# Patient Record
Sex: Female | Born: 2000 | Race: Black or African American | Hispanic: No | Marital: Single | Smoking: Never smoker
Health system: Southern US, Community
[De-identification: ages and names within clinical notes are randomized; demographics above are authoritative.]

---
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
## 2019-05-29 IMAGING — CT CT ABD-PELV W/ CM
2 of 4 series · 16 of 46 positions shown, 18 images · IV contrast (APPLIED)
Comparison: None.

CLINICAL DATA: RIGHT lower quadrant pain and fever, nausea and
weakness. Suspect appendicitis or abscess.

EXAM:
CT ABDOMEN AND PELVIS WITH CONTRAST
TECHNIQUE: Multidetector CT imaging of the abdomen and pelvis was performed
using the standard protocol following bolus administration of
intravenous contrast.
CONTRAST:  100mL 50NNPY-0ZZ IOPAMIDOL (50NNPY-0ZZ) INJECTION 61%

[Series 3: abd/ pelvis 5.0 i30f 2 · axial · 0.68mm/px · z∈[+908,+1313]mm · 13 of 89 slices shown, 15 images]
[im 4/89  soft-tissue]
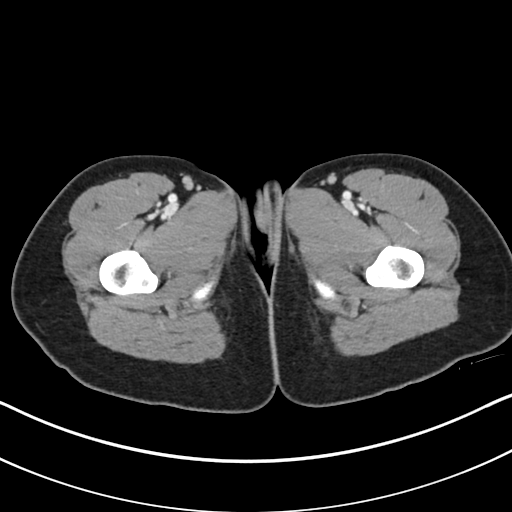
[im 4/89  bone]
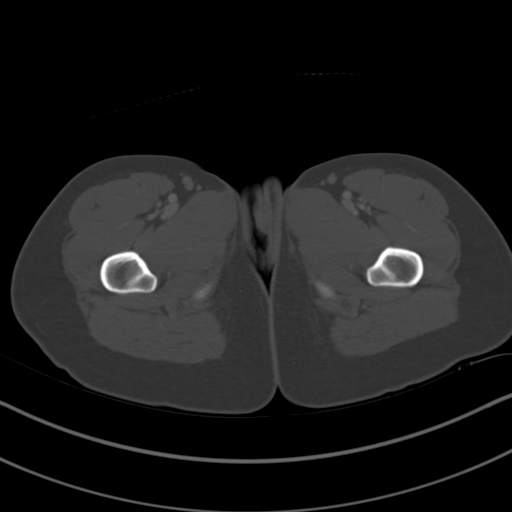
[im 11/89  soft-tissue]
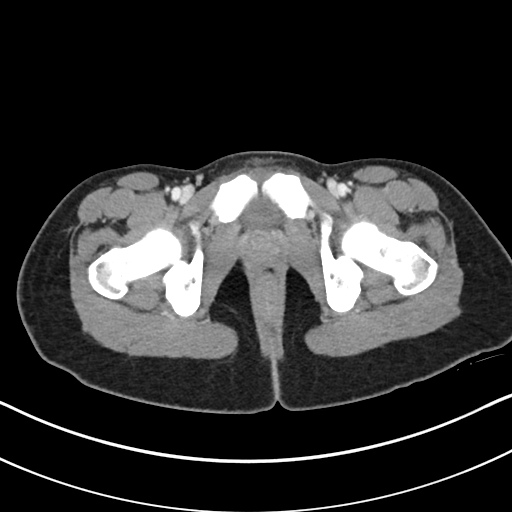
[im 17/89  soft-tissue]
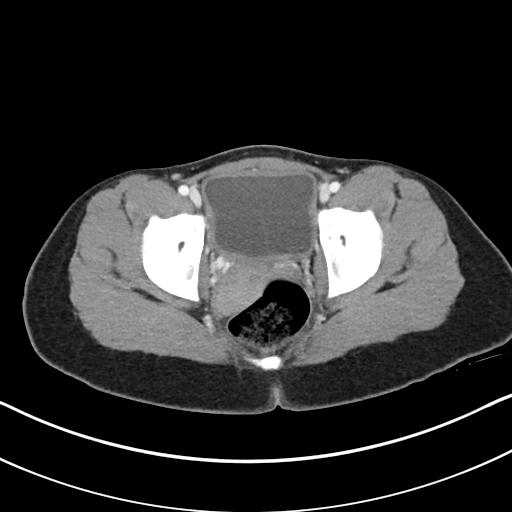
[im 24/89  soft-tissue]
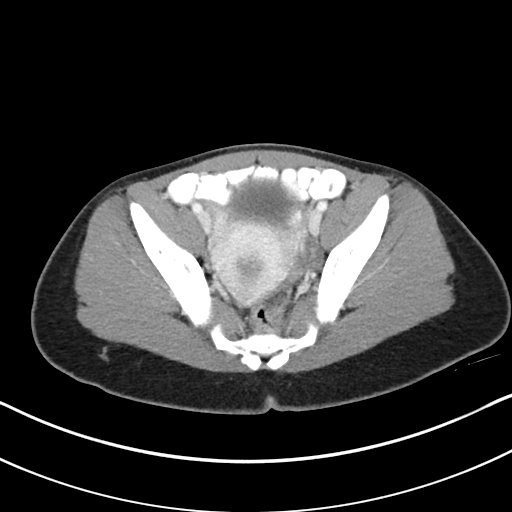
[im 31/89  soft-tissue]
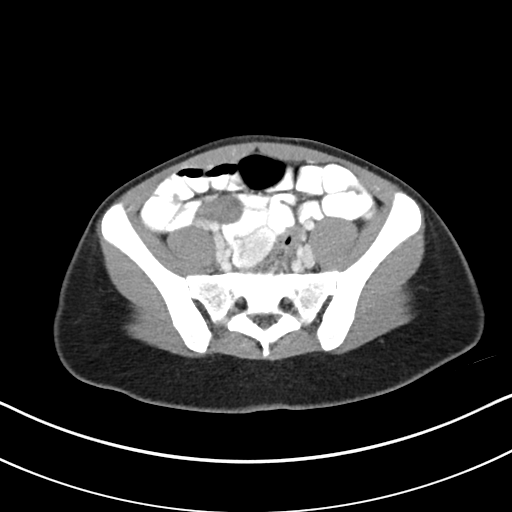
[im 38/89  soft-tissue]
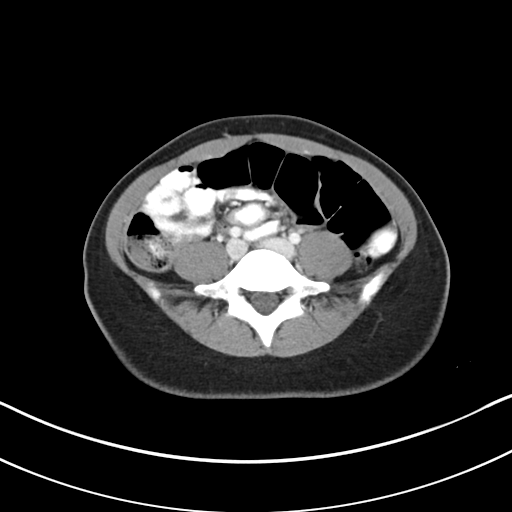
[im 45/89  soft-tissue]
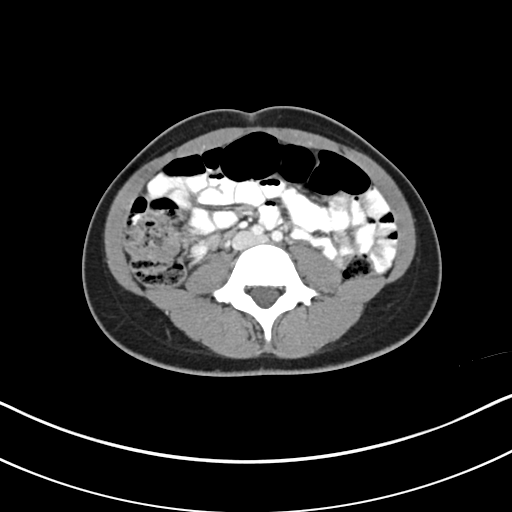
[im 51/89  soft-tissue]
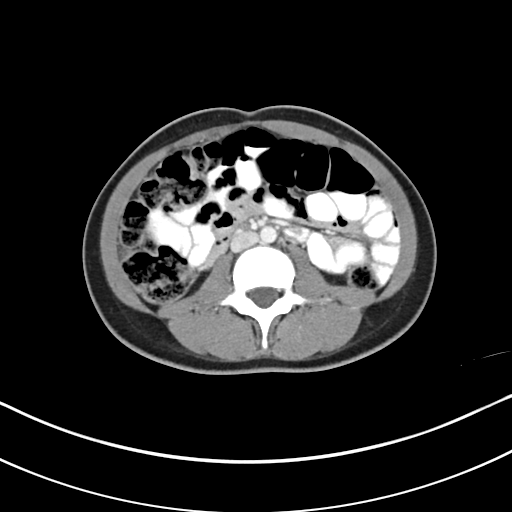
[im 58/89  soft-tissue]
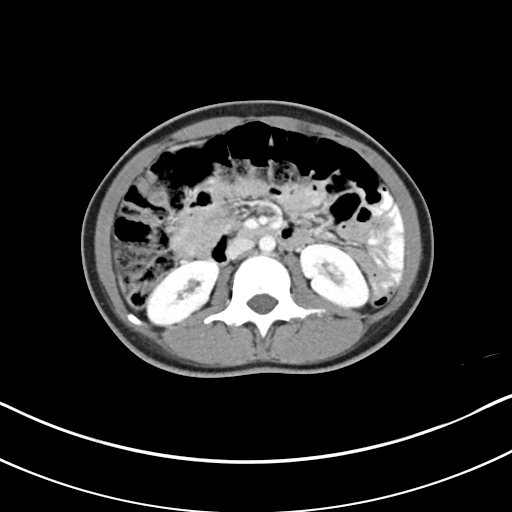
[im 58/89  bone]
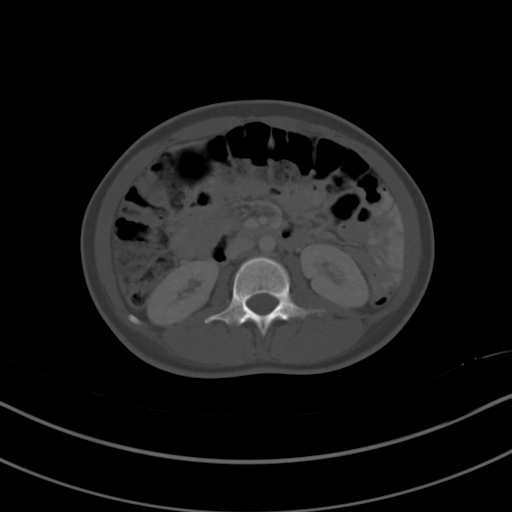
[im 65/89  soft-tissue]
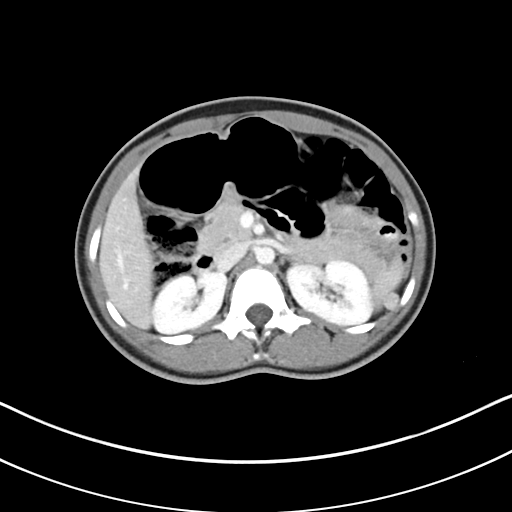
[im 72/89  soft-tissue]
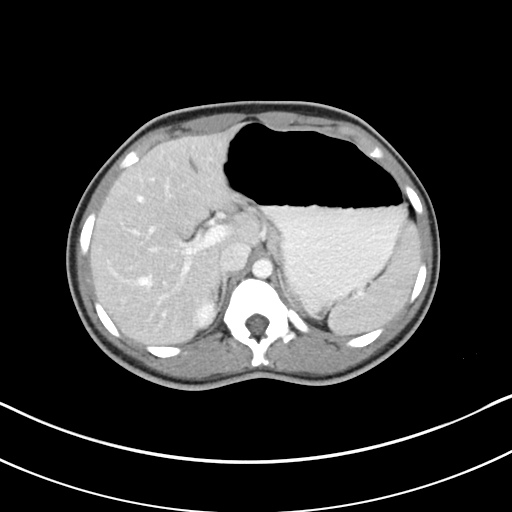
[im 78/89  soft-tissue]
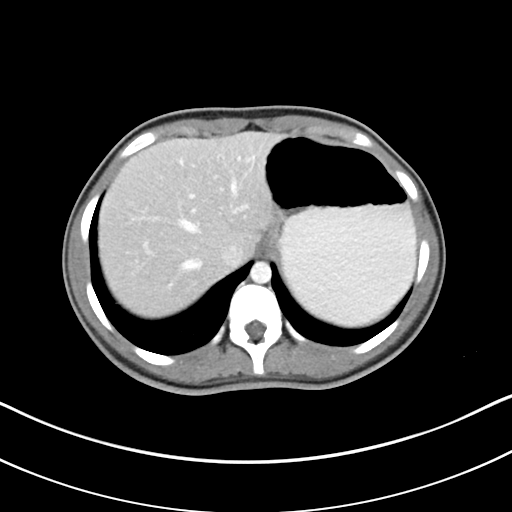
[im 85/89  soft-tissue]
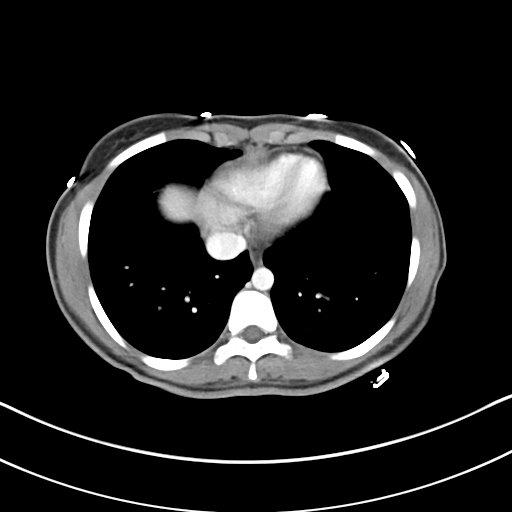

[Series 6: coronal soft tissue · coronal · 0.63mm/px · 3 of 81 slices shown]
[im 27/81  soft-tissue]
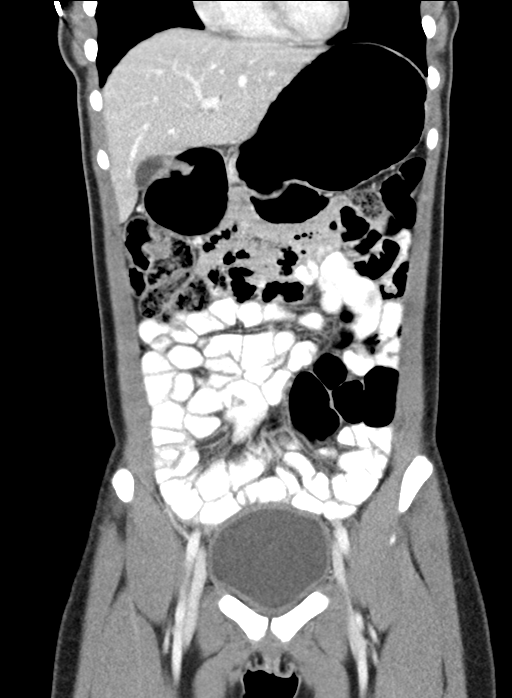
[im 36/81  soft-tissue]
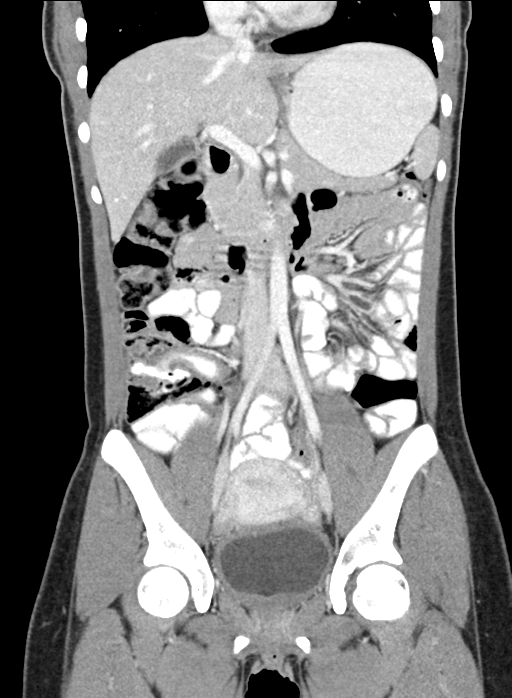
[im 45/81  soft-tissue]
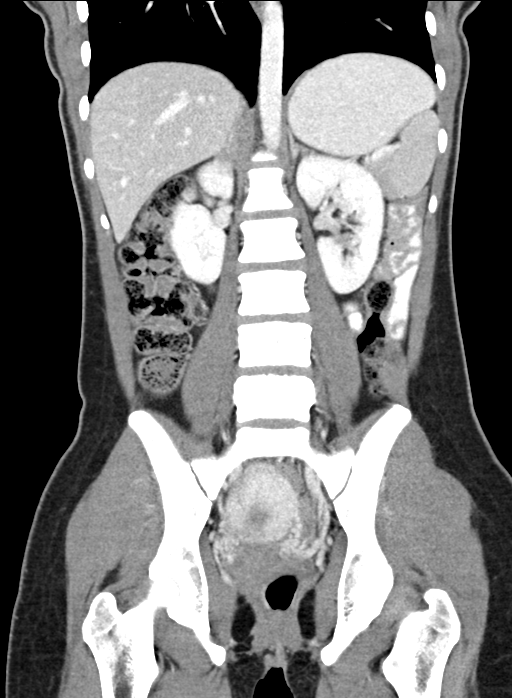

[16 of 46 positions shown; findings below may reference images not displayed]

FINDINGS: LOWER CHEST: Lung bases are clear. Included heart size is normal. No
pericardial effusion.

HEPATOBILIARY: Liver and gallbladder are normal.

PANCREAS: Normal.

SPLEEN: Normal.

ADRENALS/URINARY TRACT: Kidneys are orthotopic, demonstrating
symmetric enhancement. 2 mm RIGHT interpolar nephrolithiasis. No
hydronephrosis or solid renal masses. The unopacified ureters are
normal in course and caliber. Urinary bladder is well distended with
disproportionate mild wall thickening and perivesicular fat
stranding. Normal adrenal glands.

STOMACH/BOWEL: The stomach, small and large bowel are normal in
course and caliber without inflammatory changes. Normal appendix.

VASCULAR/LYMPHATIC: Aortoiliac vessels are normal in course and
caliber. No lymphadenopathy by CT size criteria.

REPRODUCTIVE: Normal.

OTHER: No intraperitoneal free fluid or free air.

MUSCULOSKELETAL: Nonacute.
IMPRESSION: 1. CT findings of cystitis without pyelonephritis.
2. 2 mm nonobstructing RIGHT nephrolithiasis.
3. Normal appendix.

## 2020-07-28 ENCOUNTER — Ambulatory Visit: Payer: BC Managed Care – PPO | Attending: Family

## 2020-07-28 DIAGNOSIS — Z23 Encounter for immunization: Secondary | ICD-10-CM

## 2020-08-07 NOTE — Progress Notes (Signed)
   Covid-19 Vaccination Clinic  Name:  Helen Meyer    MRN: [id number] DOB: 2001-05-25  08/07/2020  Helen Meyer was observed post Covid-19 immunization for 15 minutes without incident. She was provided with Vaccine Information Sheet and instruction to access the V-Safe system.   Helen Meyer was instructed to call 911 with any severe reactions post vaccine: Marland Kitchen Difficulty breathing  . Swelling of face and throat  . A fast heartbeat  . A bad rash all over body  . Dizziness and weakness   Immunizations Administered    Name Date Dose VIS Date Route   Pfizer COVID-19 Vaccine 07/28/2020 10:45 AM 0.3 mL 01/26/2019 Intramuscular   Manufacturer: [employer], [employer]   Lot: [id number]   NDC: [address]-4

## 2020-08-18 ENCOUNTER — Ambulatory Visit: Payer: BC Managed Care – PPO | Attending: Family

## 2020-08-18 DIAGNOSIS — Z23 Encounter for immunization: Secondary | ICD-10-CM

## 2020-09-10 NOTE — Progress Notes (Signed)
° °  Covid-19 Vaccination Clinic  Name:  Suesan Mohrmann    MRN: [id number] DOB: January 01, 2001  09/10/2020  Ms. Vandeventer was observed post Covid-19 immunization for 15 minutes without incident. She was provided with Vaccine Information Sheet and instruction to access the V-Safe system.   Ms. Miao was instructed to call 911 with any severe reactions post vaccine:  Difficulty breathing   Swelling of face and throat   A fast heartbeat   A bad rash all over body   Dizziness and weakness   Immunizations Administered    Name Date Dose VIS Date Route   Pfizer COVID-19 Vaccine 08/18/2020 12:00 AM 0.3 mL 01/26/2019 Intramuscular   Manufacturer: [employer], [employer]   Lot: [id number]   NDC: [address]-9
